# Patient Record
Sex: Female | Born: 2000 | Race: White | Hispanic: No | Marital: Single
Health system: Southern US, Community
[De-identification: ages and names within clinical notes are randomized; demographics above are authoritative.]

## PROBLEM LIST (undated history)

## (undated) DIAGNOSIS — J45909 Unspecified asthma, uncomplicated: Secondary | ICD-10-CM

## (undated) DIAGNOSIS — R51 Headache: Secondary | ICD-10-CM

## (undated) DIAGNOSIS — R519 Headache, unspecified: Secondary | ICD-10-CM

## (undated) DIAGNOSIS — F419 Anxiety disorder, unspecified: Secondary | ICD-10-CM

## (undated) HISTORY — PX: WISDOM TOOTH EXTRACTION: SHX21

---
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
## 2024-03-24 ENCOUNTER — Encounter: Admitting: Women's Health

## 2024-05-07 ENCOUNTER — Encounter: Payer: Self-pay | Admitting: Obstetrics and Gynecology

## 2024-06-09 ENCOUNTER — Ambulatory Visit: Admitting: Family Medicine

## 2024-06-11 ENCOUNTER — Encounter: Payer: Self-pay | Admitting: Obstetrics and Gynecology
